# Patient Record
Sex: Female | Born: 1965 | Race: White | Hispanic: No | Marital: Married | Smoking: Never smoker
Health system: Southern US, Community
[De-identification: ages and names within clinical notes are randomized; demographics above are authoritative.]

## PROBLEM LIST (undated history)

## (undated) DIAGNOSIS — I10 Essential (primary) hypertension: Secondary | ICD-10-CM

## (undated) DIAGNOSIS — F419 Anxiety disorder, unspecified: Secondary | ICD-10-CM

---

## 1998-06-26 ENCOUNTER — Other Ambulatory Visit: Admission: RE | Admit: 1998-06-26 | Discharge: 1998-06-26 | Payer: Self-pay | Admitting: Obstetrics and Gynecology

## 1999-07-29 ENCOUNTER — Other Ambulatory Visit: Admission: RE | Admit: 1999-07-29 | Discharge: 1999-07-29 | Payer: Self-pay | Admitting: Obstetrics and Gynecology

## 2000-02-02 ENCOUNTER — Ambulatory Visit (HOSPITAL_COMMUNITY): Admission: RE | Admit: 2000-02-02 | Discharge: 2000-02-02 | Payer: Self-pay | Admitting: Orthopedic Surgery

## 2000-02-02 ENCOUNTER — Encounter: Payer: Self-pay | Admitting: Orthopedic Surgery

## 2000-02-19 ENCOUNTER — Ambulatory Visit (HOSPITAL_COMMUNITY): Admission: RE | Admit: 2000-02-19 | Discharge: 2000-02-19 | Payer: Self-pay | Admitting: Orthopedic Surgery

## 2000-02-19 ENCOUNTER — Encounter: Payer: Self-pay | Admitting: Orthopedic Surgery

## 2000-04-14 ENCOUNTER — Ambulatory Visit (HOSPITAL_COMMUNITY): Admission: RE | Admit: 2000-04-14 | Discharge: 2000-04-14 | Payer: Self-pay | Admitting: Orthopedic Surgery

## 2000-04-14 ENCOUNTER — Encounter: Payer: Self-pay | Admitting: Orthopedic Surgery

## 2000-04-30 ENCOUNTER — Ambulatory Visit (HOSPITAL_COMMUNITY): Admission: RE | Admit: 2000-04-30 | Discharge: 2000-04-30 | Payer: Self-pay | Admitting: Orthopedic Surgery

## 2000-04-30 ENCOUNTER — Encounter: Payer: Self-pay | Admitting: Orthopedic Surgery

## 2001-02-10 ENCOUNTER — Other Ambulatory Visit: Admission: RE | Admit: 2001-02-10 | Discharge: 2001-02-10 | Payer: Self-pay | Admitting: Obstetrics and Gynecology

## 2002-07-21 ENCOUNTER — Other Ambulatory Visit: Admission: RE | Admit: 2002-07-21 | Discharge: 2002-07-21 | Payer: Self-pay | Admitting: Obstetrics and Gynecology

## 2003-04-20 ENCOUNTER — Inpatient Hospital Stay (HOSPITAL_COMMUNITY): Admission: AD | Admit: 2003-04-20 | Discharge: 2003-04-20 | Payer: Self-pay | Admitting: Obstetrics & Gynecology

## 2003-10-16 ENCOUNTER — Other Ambulatory Visit: Admission: RE | Admit: 2003-10-16 | Discharge: 2003-10-16 | Payer: Self-pay | Admitting: Obstetrics and Gynecology

## 2005-03-17 ENCOUNTER — Other Ambulatory Visit: Admission: RE | Admit: 2005-03-17 | Discharge: 2005-03-17 | Payer: Self-pay | Admitting: Obstetrics and Gynecology

## 2014-03-20 ENCOUNTER — Encounter (HOSPITAL_BASED_OUTPATIENT_CLINIC_OR_DEPARTMENT_OTHER): Payer: Self-pay | Admitting: Emergency Medicine

## 2014-03-20 ENCOUNTER — Emergency Department (HOSPITAL_BASED_OUTPATIENT_CLINIC_OR_DEPARTMENT_OTHER)
Admission: EM | Admit: 2014-03-20 | Discharge: 2014-03-20 | Disposition: A | Discharge status: Home / Self Care | Payer: Managed Care, Other (non HMO) | Attending: Emergency Medicine | Admitting: Emergency Medicine

## 2014-03-20 ENCOUNTER — Emergency Department (HOSPITAL_BASED_OUTPATIENT_CLINIC_OR_DEPARTMENT_OTHER): Payer: Managed Care, Other (non HMO)

## 2014-03-20 DIAGNOSIS — I10 Essential (primary) hypertension: Secondary | ICD-10-CM | POA: Diagnosis not present

## 2014-03-20 DIAGNOSIS — F419 Anxiety disorder, unspecified: Secondary | ICD-10-CM | POA: Insufficient documentation

## 2014-03-20 DIAGNOSIS — Z3202 Encounter for pregnancy test, result negative: Secondary | ICD-10-CM | POA: Insufficient documentation

## 2014-03-20 DIAGNOSIS — R109 Unspecified abdominal pain: Secondary | ICD-10-CM | POA: Diagnosis present

## 2014-03-20 DIAGNOSIS — N200 Calculus of kidney: Secondary | ICD-10-CM | POA: Diagnosis not present

## 2014-03-20 DIAGNOSIS — Z79899 Other long term (current) drug therapy: Secondary | ICD-10-CM | POA: Insufficient documentation

## 2014-03-20 DIAGNOSIS — Z880 Allergy status to penicillin: Secondary | ICD-10-CM | POA: Diagnosis not present

## 2014-03-20 HISTORY — DX: Essential (primary) hypertension: I10

## 2014-03-20 HISTORY — DX: Anxiety disorder, unspecified: F41.9

## 2014-03-20 LAB — URINALYSIS, ROUTINE W REFLEX MICROSCOPIC
Bilirubin Urine: NEGATIVE
GLUCOSE, UA: NEGATIVE mg/dL
Ketones, ur: >80 mg/dL — AB
Nitrite: NEGATIVE
PH: 5.5 (ref 5.0–8.0)
Protein, ur: 30 mg/dL — AB
SPECIFIC GRAVITY, URINE: 1.028 (ref 1.005–1.030)
Urobilinogen, UA: 0.2 mg/dL (ref 0.0–1.0)

## 2014-03-20 LAB — URINE MICROSCOPIC-ADD ON

## 2014-03-20 LAB — PREGNANCY, URINE: PREG TEST UR: NEGATIVE

## 2014-03-20 MED ORDER — HYDROCODONE-ACETAMINOPHEN 5-325 MG PO TABS: 1.0000 | ORAL_TABLET | Freq: Four times a day (QID) | ORAL | Status: AC | PRN

## 2014-03-20 MED ORDER — KETOROLAC TROMETHAMINE 60 MG/2ML IM SOLN
60.0000 mg | Freq: Once | INTRAMUSCULAR | Status: AC
  Administered 2014-03-20: 60 mg via INTRAMUSCULAR
  Filled 2014-03-20: qty 2

## 2014-03-20 NOTE — ED Notes (Signed)
Pt c/o left flank pain x 6 hrs

## 2014-03-20 NOTE — Discharge Instructions (Signed)
Hydrocodone as prescribed as needed for pain.  Followup with urology. Call tomorrow to arrange this appointment. The contact information has been provided on this discharge summary.  Return to the emergency department if you develop high fever, worsening pain, or other new and concerning symptoms.   Kidney Stones Kidney stones (urolithiasis) are deposits that form inside your kidneys. The intense pain is caused by the stone moving through the urinary tract. When the stone moves, the ureter goes into spasm around the stone. The stone is usually passed in the urine.  CAUSES   A disorder that makes certain neck glands produce too much parathyroid hormone (primary hyperparathyroidism).  A buildup of uric acid crystals, similar to gout in your joints.  Narrowing (stricture) of the ureter.  A kidney obstruction present at birth (congenital obstruction).  Previous surgery on the kidney or ureters.  Numerous kidney infections. SYMPTOMS   Feeling sick to your stomach (nauseous).  Throwing up (vomiting).  Blood in the urine (hematuria).  Pain that usually spreads (radiates) to the groin.  Frequency or urgency of urination. DIAGNOSIS   Taking a history and physical exam.  Blood or urine tests.  CT scan.  Occasionally, an examination of the inside of the urinary bladder (cystoscopy) is performed. TREATMENT   Observation.  Increasing your fluid intake.  Extracorporeal shock wave lithotripsy--This is a noninvasive procedure that uses shock waves to break up kidney stones.  Surgery may be needed if you have severe pain or persistent obstruction. There are various surgical procedures. Most of the procedures are performed with the use of small instruments. Only small incisions are needed to accommodate these instruments, so recovery time is minimized. The size, location, and chemical composition are all important variables that will determine the proper choice of action for you.  Talk to your health care provider to better understand your situation so that you will minimize the risk of injury to yourself and your kidney.  HOME CARE INSTRUCTIONS   Drink enough water and fluids to keep your urine clear or pale yellow. This will help you to pass the stone or stone fragments.  Strain all urine through the provided strainer. Keep all particulate matter and stones for your health care provider to see. The stone causing the pain may be as small as a grain of salt. It is very important to use the strainer each and every time you pass your urine. The collection of your stone will allow your health care provider to analyze it and verify that a stone has actually passed. The stone analysis will often identify what you can do to reduce the incidence of recurrences.  Only take over-the-counter or prescription medicines for pain, discomfort, or fever as directed by your health care provider.  Make a follow-up appointment with your health care provider as directed.  Get follow-up X-rays if required. The absence of pain does not always mean that the stone has passed. It may have only stopped moving. If the urine remains completely obstructed, it can cause loss of kidney function or even complete destruction of the kidney. It is your responsibility to make sure X-rays and follow-ups are completed. Ultrasounds of the kidney can show blockages and the status of the kidney. Ultrasounds are not associated with any radiation and can be performed easily in a matter of minutes. SEEK MEDICAL CARE IF:  You experience pain that is progressive and unresponsive to any pain medicine you have been prescribed. SEEK IMMEDIATE MEDICAL CARE IF:   Pain cannot  be controlled with the prescribed medicine.  You have a fever or shaking chills.  The severity or intensity of pain increases over 18 hours and is not relieved by pain medicine.  You develop a new onset of abdominal pain.  You feel faint or pass  out.  You are unable to urinate. MAKE SURE YOU:   Understand these instructions.  Will watch your condition.  Will get help right away if you are not doing well or get worse. Document Released: 06/01/2005 Document Revised: 02/01/2013 Document Reviewed: 11/02/2012 [facility] Patient Information 2015 [place], [place]. This information is not intended to replace advice given to you by your health care provider. Make sure you discuss any questions you have with your health care provider.

## 2014-03-21 NOTE — ED Provider Notes (Signed)
CSN: [id number]     Arrival date & time 03/20/14  1341 History   First MD Initiated Contact with Patient 03/20/14 1437     Chief Complaint  Patient presents with  . Flank Pain     (Consider location/radiation/quality/duration/timing/severity/associated sxs/prior Treatment) HPI Comments: Patient presents with complaint of left flank pain that started abruptly about six hours ago.  No urinary or bowel complaints.  No fevers or chills.  No injury or trauma.  Patient is a 48 y.o. female presenting with flank pain. The history is provided by the patient.  Flank Pain This is a new problem. Episode onset: 6 hours ago. The problem occurs constantly. The problem has been rapidly worsening. Associated symptoms include abdominal pain. Nothing aggravates the symptoms. Nothing relieves the symptoms. She has tried nothing for the symptoms. The treatment provided no relief.    Past Medical History  Diagnosis Date  . Hypertension   . Anxiety    History reviewed. No pertinent past surgical history. History reviewed. No pertinent family history. History  Substance Use Topics  . Smoking status: Never Smoker   . Smokeless tobacco: Not on file  . Alcohol Use: No   OB History   Grav Para Term Preterm Abortions TAB SAB Ect Mult Living                 Review of Systems  Gastrointestinal: Positive for abdominal pain.  Genitourinary: Positive for flank pain.  All other systems reviewed and are negative.     Allergies  Penicillins  Home Medications   Prior to Admission medications   Medication Sig Start Date End Date Taking? Authorizing Provider  buPROPion (WELLBUTRIN XL) 300 MG 24 hr tablet Take 300 mg by mouth daily.   Yes Historical Provider, MD  lisinopril-hydrochlorothiazide (PRINZIDE,ZESTORETIC) 10-12.5 MG per tablet Take 1 tablet by mouth daily.   Yes Historical Provider, MD  HYDROcodone-acetaminophen (NORCO) 5-325 MG per tablet Take 1-2 tablets by mouth every 6 (six) hours as needed.  03/20/14   Geoffery Lyons, MD   BP 132/78  Pulse 71  Temp(Src) 98.2 F (36.8 C) (Oral)  Resp 16  Ht 5\' 7"  (1.702 m)  Wt 160 lb (72.576 kg)  BMI 25.05 kg/m2  SpO2 98%  LMP 03/17/2014 Physical Exam  Nursing note and vitals reviewed. Constitutional: She is oriented to person, place, and time. She appears well-developed and well-nourished. No distress.  HENT:  Head: Normocephalic and atraumatic.  Neck: Normal range of motion. Neck supple.  Cardiovascular: Normal rate and regular rhythm.  Exam reveals no gallop and no friction rub.   No murmur heard. Pulmonary/Chest: Effort normal and breath sounds normal. No respiratory distress. She has no wheezes.  Abdominal: Soft. Bowel sounds are normal. She exhibits no distension. There is tenderness.  There is ttp in the left flank.  No rebound or guarding to the abdomen.  Musculoskeletal: Normal range of motion.  Neurological: She is alert and oriented to person, place, and time.  Skin: Skin is warm and dry. She is not diaphoretic.    ED Course  Procedures (including critical care time) Labs Review Labs Reviewed  URINALYSIS, ROUTINE W REFLEX MICROSCOPIC - Abnormal; Notable for the following:    Hgb urine dipstick MODERATE (*)    Ketones, ur >80 (*)    Protein, ur 30 (*)    Leukocytes, UA TRACE (*)    All other components within normal limits  URINE MICROSCOPIC-ADD ON - Abnormal; Notable for the following:    Bacteria, UA  MANY (*)    All other components within normal limits  PREGNANCY, URINE    Imaging Review Ct Renal Stone Study  03/20/2014   CLINICAL DATA:  Flank pain tenderness. Urinary frequency. Initial evaluation.  EXAM: CT ABDOMEN AND PELVIS WITHOUT CONTRAST  TECHNIQUE: Multidetector CT imaging of the abdomen and pelvis was performed following the standard protocol without IV contrast.  COMPARISON:  CT 04/20/2003.  FINDINGS: Liver normal. Spleen normal. Pancreas normal. No biliary or gallbladder distention.  Adrenals normal. Right  kidney unremarkable. 8 mm stone proximal left ureter with moderate left hydronephrosis. Bladder nondistended. No phleboliths. Uterus and adnexa unremarkable.  Shotty retroperitoneal lymph nodes.  Abdominal aorta normal caliber.  Appendix not well visualized. No inflammatory change in right or left lower quadrant. No bowel distention. No free air. No mesenteric mass. No abdominal wall hernia.  Lung bases clear.  Heart size normal.  No acute bony abnormality.  IMPRESSION: 8 mm stone proximal left ureter with moderate left hydronephrosis.   Electronically Signed   By: Maisie Fushomas  Register   On: 03/20/2014 15:02     EKG Interpretation None      MDM   Final diagnoses:  Renal calculus    CT shows an 8mm stone in the left proximal ureter.  She is feeling better with meds given.  Will provide pain meds and follow up info for urology as this stone is not likely to pass without intervention.  To return if symptoms worsen or change.    Geoffery Lyonsouglas Seng Fouts, MD 03/21/14 [phone]

## 2016-01-24 IMAGING — CT CT RENAL STONE PROTOCOL
2 of 4 series · 16 of 46 positions shown, 18 images · non-contrast
Comparison: CT 04/20/2003.

CLINICAL DATA: Flank pain tenderness. Urinary frequency. Initial
evaluation.

EXAM:
CT ABDOMEN AND PELVIS WITHOUT CONTRAST
TECHNIQUE: Multidetector CT imaging of the abdomen and pelvis was performed
following the standard protocol without IV contrast.

[Series 2: renal stone > 200 lbs 5.0 b31f · axial · 0.59mm/px · z∈[-664,-254]mm · 13 of 90 slices shown, 15 images]
[im 4/90  soft-tissue]
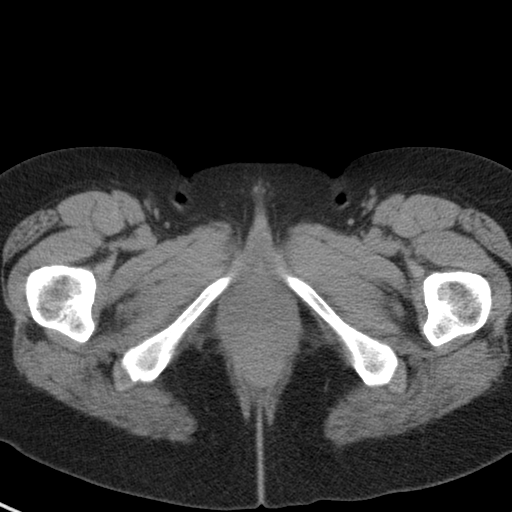
[im 4/90  bone]
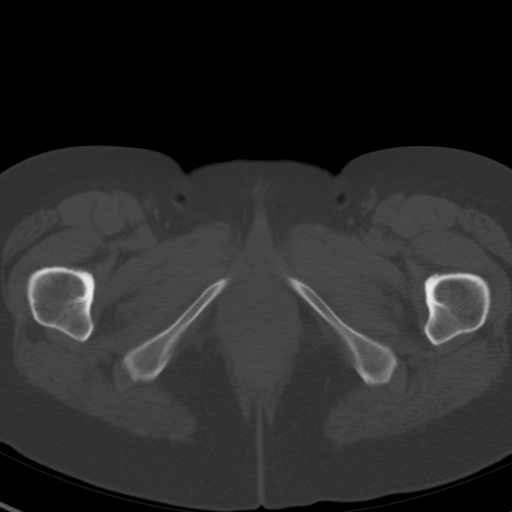
[im 12/90  soft-tissue]
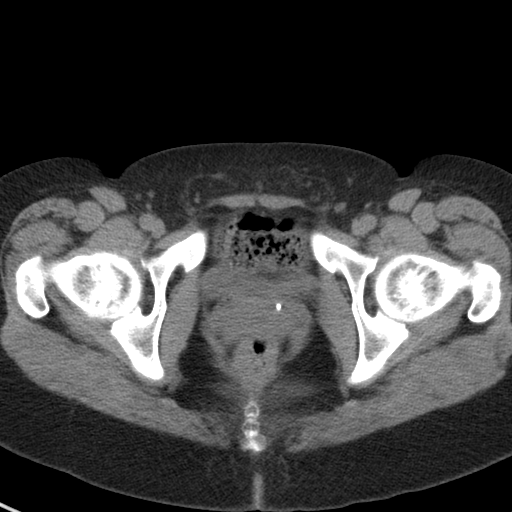
[im 19/90  soft-tissue]
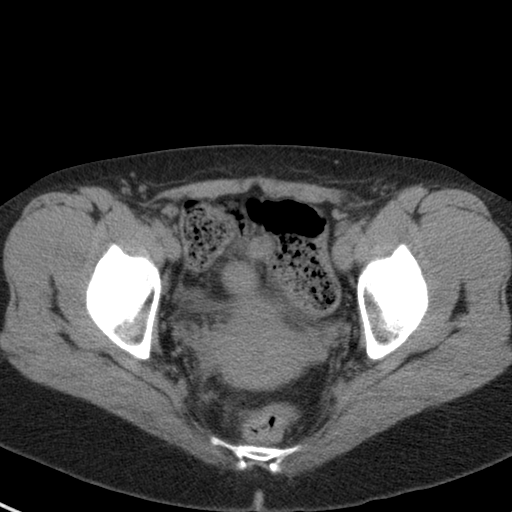
[im 26/90  soft-tissue]
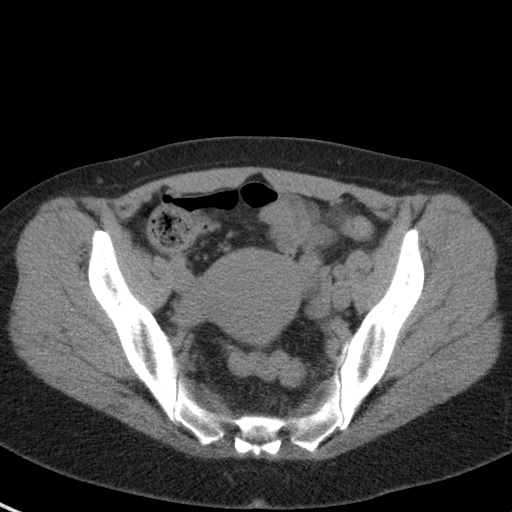
[im 30/90  soft-tissue]
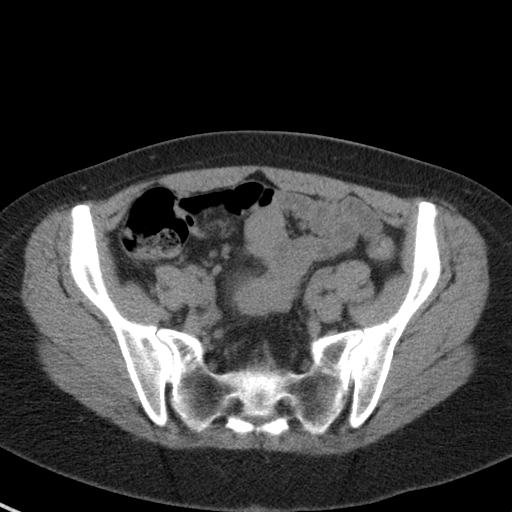
[im 38/90  soft-tissue]
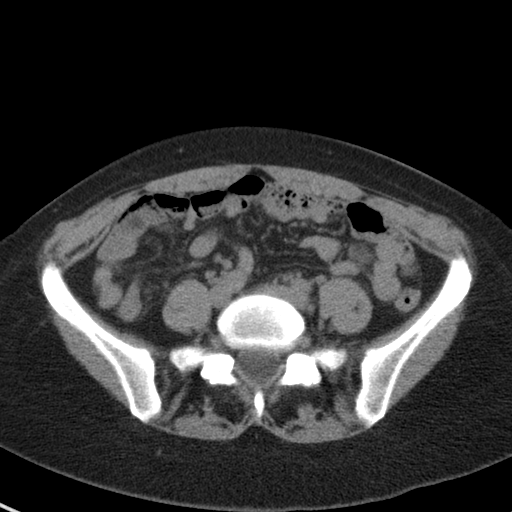
[im 45/90  soft-tissue]
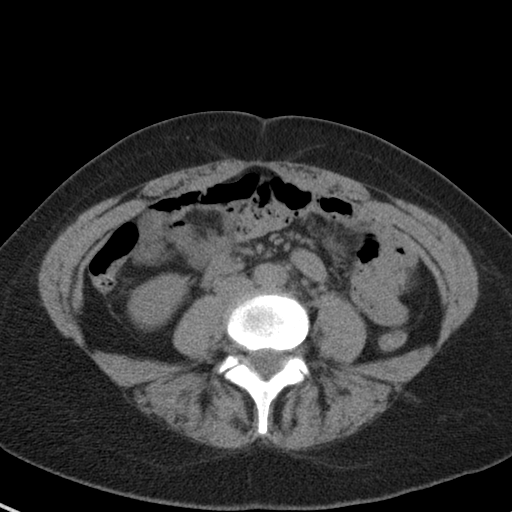
[im 52/90  soft-tissue]
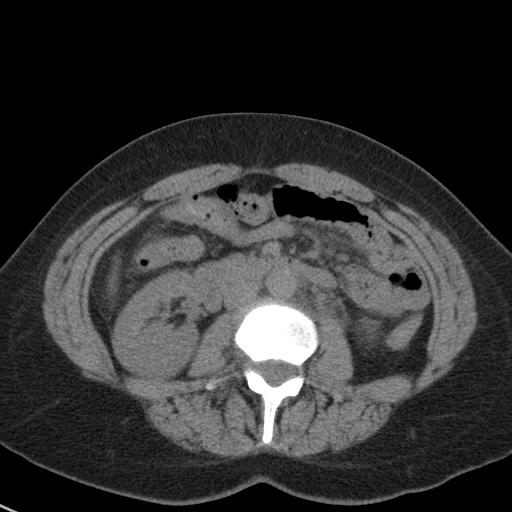
[im 60/90  soft-tissue]
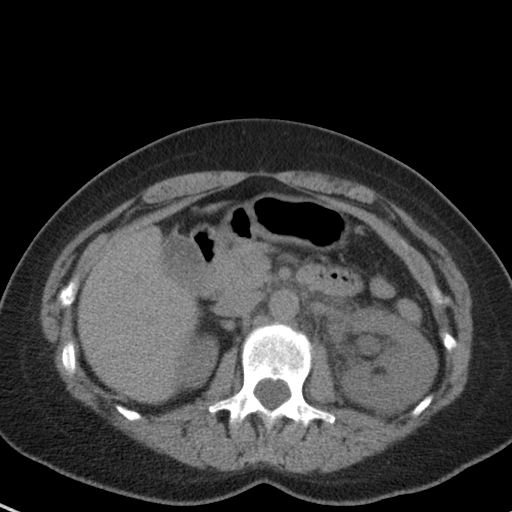
[im 60/90  bone]
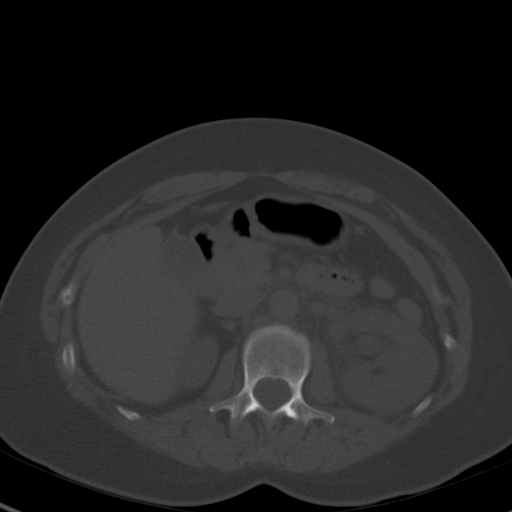
[im 64/90  soft-tissue]
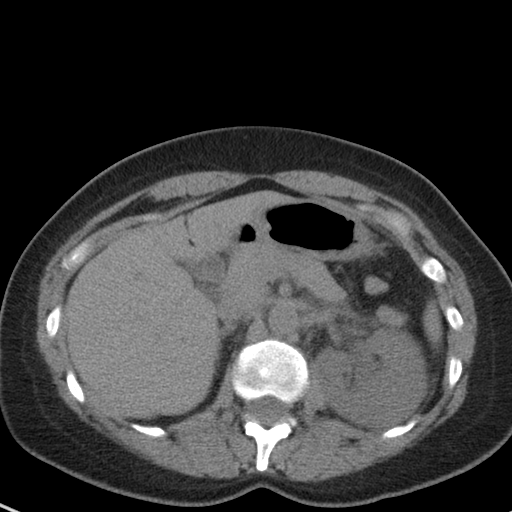
[im 71/90  soft-tissue]
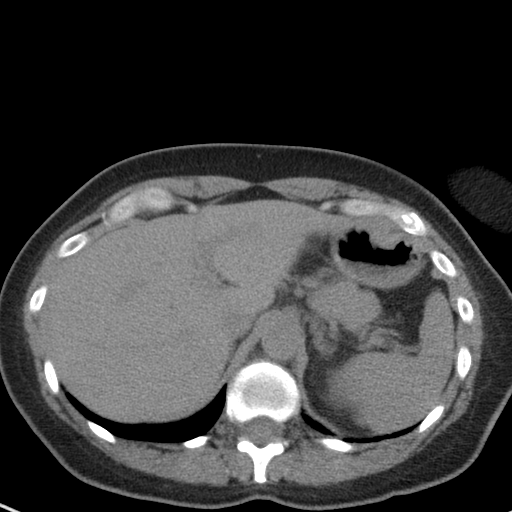
[im 78/90  soft-tissue]
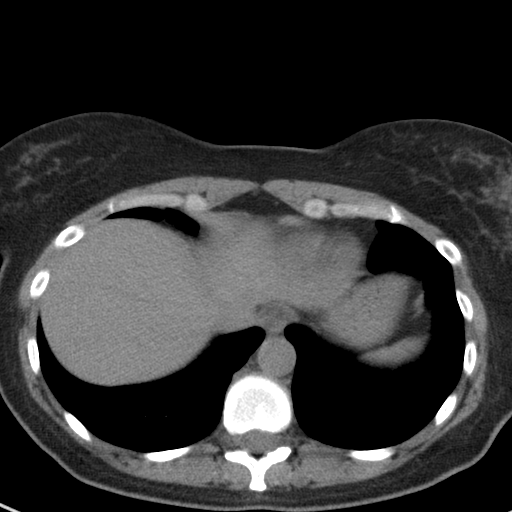
[im 86/90  soft-tissue]
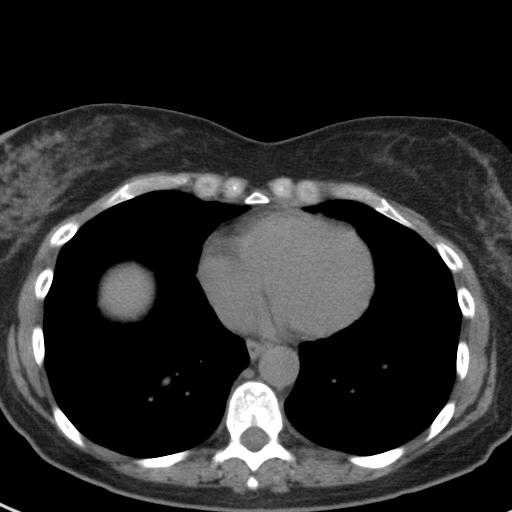

[Series 5: renal stone 3.0 coronal · coronal · 0.63mm/px · 3 of 78 slices shown]
[im 26/78  soft-tissue]
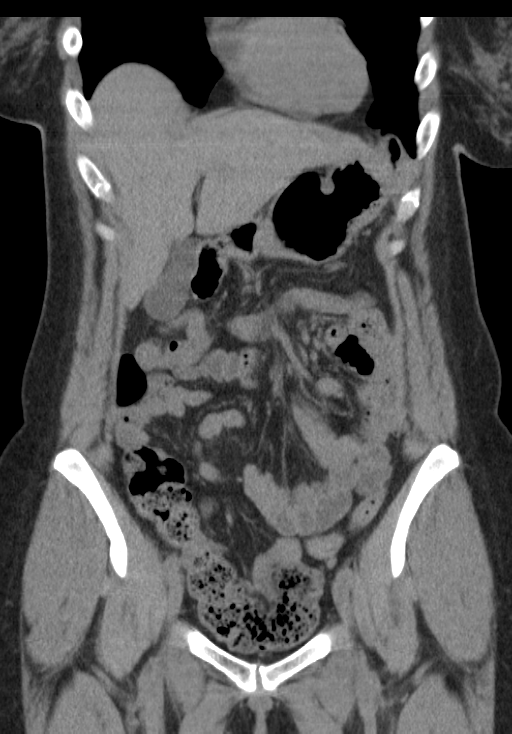
[im 35/78  soft-tissue]
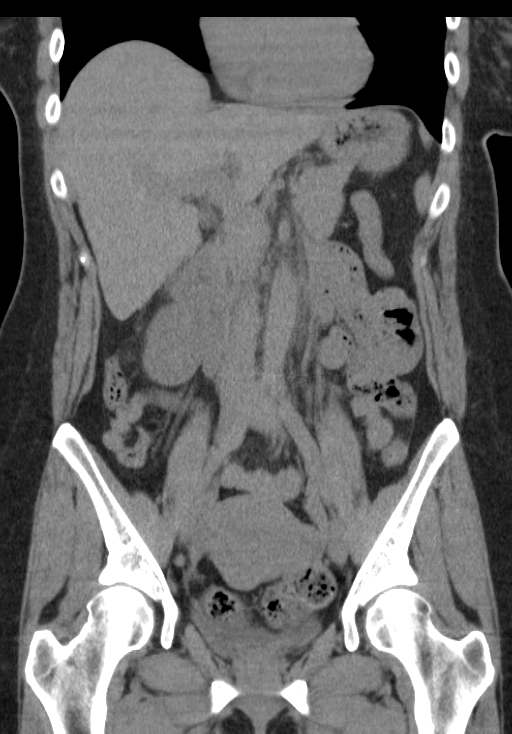
[im 43/78  soft-tissue]
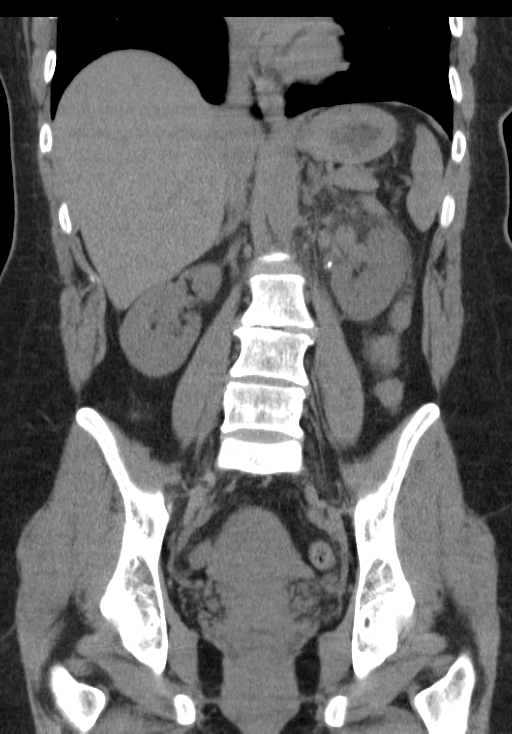

[16 of 46 positions shown; findings below may reference images not displayed]

FINDINGS: Liver normal. Spleen normal. Pancreas normal. No biliary or
gallbladder distention.

Adrenals normal. Right kidney unremarkable. 8 mm stone proximal left
ureter with moderate left hydronephrosis. Bladder nondistended. No
phleboliths. Uterus and adnexa unremarkable.

Shotty retroperitoneal lymph nodes.  Abdominal aorta normal caliber.

Appendix not well visualized. No inflammatory change in right or
left lower quadrant. No bowel distention. No free air. No mesenteric
mass. No abdominal wall hernia.

Lung bases clear.  Heart size normal.  No acute bony abnormality.
IMPRESSION: 8 mm stone proximal left ureter with moderate left hydronephrosis.
